# Patient Record
Sex: Female | Born: 1945 | Race: White | Hispanic: No | Marital: Married | Smoking: Current every day smoker
Health system: Southern US, Community
[De-identification: ages and names within clinical notes are randomized; demographics above are authoritative.]

## PROBLEM LIST (undated history)

## (undated) DIAGNOSIS — J449 Chronic obstructive pulmonary disease, unspecified: Secondary | ICD-10-CM

## (undated) DIAGNOSIS — I10 Essential (primary) hypertension: Secondary | ICD-10-CM

## (undated) DIAGNOSIS — M5136 Other intervertebral disc degeneration, lumbar region: Secondary | ICD-10-CM

## (undated) DIAGNOSIS — I639 Cerebral infarction, unspecified: Secondary | ICD-10-CM

## (undated) DIAGNOSIS — D693 Immune thrombocytopenic purpura: Secondary | ICD-10-CM

## (undated) HISTORY — PX: PLEURAL SCARIFICATION: SHX748

## (undated) HISTORY — PX: ABDOMINAL HYSTERECTOMY: SHX81

---

## 2005-12-23 ENCOUNTER — Emergency Department: Payer: Self-pay | Admitting: Emergency Medicine

## 2005-12-23 ENCOUNTER — Other Ambulatory Visit: Payer: Self-pay

## 2008-04-08 ENCOUNTER — Emergency Department (HOSPITAL_COMMUNITY): Admission: EM | Admit: 2008-04-08 | Discharge: 2008-04-08 | Payer: Self-pay | Admitting: Emergency Medicine

## 2008-11-02 ENCOUNTER — Emergency Department (HOSPITAL_COMMUNITY): Admission: EM | Admit: 2008-11-02 | Discharge: 2008-11-02 | Payer: Self-pay | Admitting: Emergency Medicine

## 2009-02-21 ENCOUNTER — Emergency Department (HOSPITAL_COMMUNITY): Admission: EM | Admit: 2009-02-21 | Discharge: 2009-02-21 | Payer: Self-pay | Admitting: Emergency Medicine

## 2009-05-20 ENCOUNTER — Emergency Department (HOSPITAL_COMMUNITY): Admission: EM | Admit: 2009-05-20 | Discharge: 2009-05-20 | Payer: Self-pay | Admitting: Emergency Medicine

## 2009-10-27 ENCOUNTER — Emergency Department (HOSPITAL_COMMUNITY): Admission: EM | Admit: 2009-10-27 | Discharge: 2009-10-27 | Payer: Self-pay | Admitting: Emergency Medicine

## 2009-12-26 ENCOUNTER — Emergency Department (HOSPITAL_COMMUNITY): Admission: EM | Admit: 2009-12-26 | Discharge: 2009-12-26 | Payer: Self-pay | Admitting: Emergency Medicine

## 2010-02-09 ENCOUNTER — Inpatient Hospital Stay (HOSPITAL_COMMUNITY): Admission: EM | Admit: 2010-02-09 | Discharge: 2010-02-11 | Payer: Self-pay | Admitting: Emergency Medicine

## 2010-02-10 ENCOUNTER — Ambulatory Visit: Payer: Self-pay | Admitting: Gastroenterology

## 2010-02-12 ENCOUNTER — Emergency Department (HOSPITAL_COMMUNITY): Admission: EM | Admit: 2010-02-12 | Discharge: 2010-02-12 | Payer: Self-pay | Admitting: Emergency Medicine

## 2010-02-26 ENCOUNTER — Ambulatory Visit: Payer: Self-pay | Admitting: Gastroenterology

## 2010-04-25 ENCOUNTER — Emergency Department (HOSPITAL_COMMUNITY)
Admission: EM | Admit: 2010-04-25 | Discharge: 2010-04-25 | Payer: Self-pay | Source: Home / Self Care | Admitting: Emergency Medicine

## 2010-04-29 ENCOUNTER — Inpatient Hospital Stay (HOSPITAL_COMMUNITY): Admission: EM | Admit: 2010-04-29 | Discharge: 2010-04-30 | Payer: Self-pay | Source: Home / Self Care

## 2010-08-05 LAB — COMPREHENSIVE METABOLIC PANEL
AST: 38 U/L — ABNORMAL HIGH (ref 0–37)
Alkaline Phosphatase: 108 U/L (ref 39–117)
BUN: 22 mg/dL (ref 6–23)
CO2: 22 mEq/L (ref 19–32)
Chloride: 109 mEq/L (ref 96–112)
Creatinine, Ser: 1.41 mg/dL — ABNORMAL HIGH (ref 0.4–1.2)
GFR calc non Af Amer: 38 mL/min — ABNORMAL LOW (ref >60)
Potassium: 4.8 mEq/L (ref 3.5–5.1)
Total Bilirubin: 0.5 mg/dL (ref 0.3–1.2)

## 2010-08-05 LAB — CBC
HCT: 35.8 % — ABNORMAL LOW (ref 36.0–46.0)
HCT: 38.3 % (ref 36.0–46.0)
Hemoglobin: 12.4 g/dL (ref 12.0–15.0)
Hemoglobin: 13.5 g/dL (ref 12.0–15.0)
MCH: 31.1 pg (ref 26.0–34.0)
MCHC: 35.2 g/dL (ref 30.0–36.0)
MCV: 89.7 fL (ref 78.0–100.0)
Platelets: 93 10*3/uL — ABNORMAL LOW (ref 150–400)
RBC: 3.99 MIL/uL (ref 3.87–5.11)
WBC: 14.3 10*3/uL — ABNORMAL HIGH (ref 4.0–10.5)
WBC: 15.6 10*3/uL — ABNORMAL HIGH (ref 4.0–10.5)

## 2010-08-05 LAB — DIFFERENTIAL
Basophils Absolute: 0 10*3/uL (ref 0.0–0.1)
Basophils Relative: 0 % (ref 0–1)
Eosinophils Absolute: 0 10*3/uL (ref 0.0–0.7)
Eosinophils Relative: 0 % (ref 0–5)
Eosinophils Relative: 0 % (ref 0–5)
Lymphocytes Relative: 5 % — ABNORMAL LOW (ref 12–46)
Lymphocytes Relative: 6 % — ABNORMAL LOW (ref 12–46)
Lymphs Abs: 0.8 10*3/uL (ref 0.7–4.0)
Monocytes Absolute: 0.2 10*3/uL (ref 0.1–1.0)
Monocytes Absolute: 0.5 10*3/uL (ref 0.1–1.0)
Monocytes Relative: 4 % (ref 3–12)
Neutro Abs: 14.5 10*3/uL — ABNORMAL HIGH (ref 1.7–7.7)

## 2010-08-05 LAB — BASIC METABOLIC PANEL
CO2: 22 mEq/L (ref 19–32)
Chloride: 103 mEq/L (ref 96–112)
Creatinine, Ser: 1.5 mg/dL — ABNORMAL HIGH (ref 0.4–1.2)
GFR calc Af Amer: 42 mL/min — ABNORMAL LOW (ref >60)
Potassium: 3.6 mEq/L (ref 3.5–5.1)

## 2010-08-05 LAB — BLOOD GAS, ARTERIAL
Acid-base deficit: 0.7 mmol/L (ref 0.0–2.0)
Bicarbonate: 22.3 mEq/L (ref 20.0–24.0)
O2 Saturation: 92.1 %
Patient temperature: 37
TCO2: 19.6 mmol/L (ref 0–100)
pO2, Arterial: 57.1 mmHg — ABNORMAL LOW (ref 80.0–100.0)

## 2010-08-05 LAB — GLUCOSE, CAPILLARY: Glucose-Capillary: 148 mg/dL — ABNORMAL HIGH (ref 70–99)

## 2010-08-07 LAB — CARDIAC PANEL(CRET KIN+CKTOT+MB+TROPI)
CK, MB: 3.6 ng/mL (ref 0.3–4.0)
CK, MB: 3.8 ng/mL (ref 0.3–4.0)
CK, MB: 4.1 ng/mL — ABNORMAL HIGH (ref 0.3–4.0)
CK, MB: 4.6 ng/mL — ABNORMAL HIGH (ref 0.3–4.0)
Relative Index: 0.6 (ref 0.0–2.5)
Relative Index: 0.9 (ref 0.0–2.5)
Total CK: 763 U/L — ABNORMAL HIGH (ref 7–177)
Troponin I: 0.02 ng/mL (ref 0.00–0.06)

## 2010-08-07 LAB — URINALYSIS, ROUTINE W REFLEX MICROSCOPIC
Glucose, UA: NEGATIVE mg/dL
Glucose, UA: NEGATIVE mg/dL
Nitrite: NEGATIVE
Protein, ur: NEGATIVE mg/dL
Protein, ur: NEGATIVE mg/dL
Urobilinogen, UA: 0.2 mg/dL (ref 0.0–1.0)
pH: 5.5 (ref 5.0–8.0)

## 2010-08-07 LAB — RAPID URINE DRUG SCREEN, HOSP PERFORMED
Amphetamines: NOT DETECTED
Barbiturates: NOT DETECTED
Tetrahydrocannabinol: NOT DETECTED

## 2010-08-07 LAB — MRSA PCR SCREENING: MRSA by PCR: NEGATIVE

## 2010-08-07 LAB — COMPREHENSIVE METABOLIC PANEL
ALT: 22 U/L (ref 0–35)
AST: 28 U/L (ref 0–37)
Albumin: 3.4 g/dL — ABNORMAL LOW (ref 3.5–5.2)
Albumin: 3.6 g/dL (ref 3.5–5.2)
Alkaline Phosphatase: 99 U/L (ref 39–117)
BUN: 15 mg/dL (ref 6–23)
Calcium: 8.9 mg/dL (ref 8.4–10.5)
Calcium: 8.9 mg/dL (ref 8.4–10.5)
Creatinine, Ser: 1.77 mg/dL — ABNORMAL HIGH (ref 0.4–1.2)
GFR calc Af Amer: 35 mL/min — ABNORMAL LOW (ref >60)
GFR calc Af Amer: 49 mL/min — ABNORMAL LOW (ref >60)
Glucose, Bld: 106 mg/dL — ABNORMAL HIGH (ref 70–99)
Potassium: 3 mEq/L — ABNORMAL LOW (ref 3.5–5.1)
Sodium: 139 mEq/L (ref 135–145)
Total Protein: 6.1 g/dL (ref 6.0–8.3)
Total Protein: 6.6 g/dL (ref 6.0–8.3)

## 2010-08-07 LAB — CBC
HCT: 35 % — ABNORMAL LOW (ref 36.0–46.0)
HCT: 37.2 % (ref 36.0–46.0)
HCT: 37.8 % (ref 36.0–46.0)
Hemoglobin: 12.1 g/dL (ref 12.0–15.0)
Hemoglobin: 13 g/dL (ref 12.0–15.0)
MCH: 32 pg (ref 26.0–34.0)
MCHC: 34.4 g/dL (ref 30.0–36.0)
MCV: 92.5 fL (ref 78.0–100.0)
MCV: 93 fL (ref 78.0–100.0)
Platelets: 56 10*3/uL — ABNORMAL LOW (ref 150–400)
RBC: 3.8 MIL/uL — ABNORMAL LOW (ref 3.87–5.11)
RBC: 4.06 MIL/uL (ref 3.87–5.11)
RDW: 13 % (ref 11.5–15.5)
RDW: 13.1 % (ref 11.5–15.5)
RDW: 13.3 % (ref 11.5–15.5)
WBC: 13 10*3/uL — ABNORMAL HIGH (ref 4.0–10.5)
WBC: 5.9 10*3/uL (ref 4.0–10.5)
WBC: 6 10*3/uL (ref 4.0–10.5)

## 2010-08-07 LAB — BASIC METABOLIC PANEL
BUN: 11 mg/dL (ref 6–23)
CO2: 18 mEq/L — ABNORMAL LOW (ref 19–32)
Chloride: 116 mEq/L — ABNORMAL HIGH (ref 96–112)
Glucose, Bld: 97 mg/dL (ref 70–99)
Potassium: 3.6 mEq/L (ref 3.5–5.1)

## 2010-08-07 LAB — CLOSTRIDIUM DIFFICILE EIA

## 2010-08-07 LAB — DIFFERENTIAL
Basophils Absolute: 0 10*3/uL (ref 0.0–0.1)
Basophils Absolute: 0 10*3/uL (ref 0.0–0.1)
Basophils Absolute: 0.1 10*3/uL (ref 0.0–0.1)
Basophils Relative: 1 % (ref 0–1)
Eosinophils Absolute: 0.3 10*3/uL (ref 0.0–0.7)
Eosinophils Relative: 0 % (ref 0–5)
Eosinophils Relative: 4 % (ref 0–5)
Lymphocytes Relative: 10 % — ABNORMAL LOW (ref 12–46)
Lymphocytes Relative: 21 % (ref 12–46)
Lymphocytes Relative: 7 % — ABNORMAL LOW (ref 12–46)
Lymphs Abs: 0.9 10*3/uL (ref 0.7–4.0)
Lymphs Abs: 1 10*3/uL (ref 0.7–4.0)
Lymphs Abs: 1.7 10*3/uL (ref 0.7–4.0)
Monocytes Absolute: 0.5 10*3/uL (ref 0.1–1.0)
Monocytes Absolute: 0.6 10*3/uL (ref 0.1–1.0)
Monocytes Absolute: 0.7 10*3/uL (ref 0.1–1.0)
Monocytes Absolute: 0.8 10*3/uL (ref 0.1–1.0)
Monocytes Relative: 12 % (ref 3–12)
Monocytes Relative: 14 % — ABNORMAL HIGH (ref 3–12)
Monocytes Relative: 6 % (ref 3–12)
Neutro Abs: 11.5 10*3/uL — ABNORMAL HIGH (ref 1.7–7.7)
Neutro Abs: 3.8 10*3/uL (ref 1.7–7.7)
Neutro Abs: 8.5 10*3/uL — ABNORMAL HIGH (ref 1.7–7.7)
Neutrophils Relative %: 53 % (ref 43–77)
Neutrophils Relative %: 65 % (ref 43–77)

## 2010-08-07 LAB — TSH: TSH: 1.197 u[IU]/mL (ref 0.350–4.500)

## 2010-08-07 LAB — OVA AND PARASITE EXAMINATION

## 2010-08-07 LAB — PATHOLOGIST SMEAR REVIEW

## 2010-08-07 LAB — URINE MICROSCOPIC-ADD ON

## 2010-08-07 LAB — POCT I-STAT, CHEM 8
BUN: 15 mg/dL (ref 6–23)
Chloride: 107 mEq/L (ref 96–112)
Creatinine, Ser: 1.8 mg/dL — ABNORMAL HIGH (ref 0.4–1.2)
Potassium: 3.5 mEq/L (ref 3.5–5.1)
Sodium: 137 mEq/L (ref 135–145)
TCO2: 19 mmol/L (ref 0–100)

## 2010-08-07 LAB — STOOL CULTURE

## 2010-08-07 LAB — CLOSTRIDIUM DIFFICILE BY PCR: Toxigenic C. Difficile by PCR: NOT DETECTED

## 2010-08-07 LAB — MAGNESIUM: Magnesium: 1.9 mg/dL (ref 1.5–2.5)

## 2010-08-14 ENCOUNTER — Encounter (HOSPITAL_COMMUNITY): Payer: Self-pay | Admitting: Radiology

## 2010-08-14 ENCOUNTER — Emergency Department (HOSPITAL_COMMUNITY)
Admission: EM | Admit: 2010-08-14 | Discharge: 2010-08-14 | Discharge status: Against Medical Advice | Payer: Medicare PPO | Attending: Emergency Medicine | Admitting: Emergency Medicine

## 2010-08-14 ENCOUNTER — Emergency Department (HOSPITAL_COMMUNITY): Payer: Medicare PPO

## 2010-08-14 DIAGNOSIS — Z8679 Personal history of other diseases of the circulatory system: Secondary | ICD-10-CM | POA: Insufficient documentation

## 2010-08-14 DIAGNOSIS — G459 Transient cerebral ischemic attack, unspecified: Secondary | ICD-10-CM | POA: Insufficient documentation

## 2010-08-14 DIAGNOSIS — J4489 Other specified chronic obstructive pulmonary disease: Secondary | ICD-10-CM | POA: Insufficient documentation

## 2010-08-14 DIAGNOSIS — I10 Essential (primary) hypertension: Secondary | ICD-10-CM | POA: Insufficient documentation

## 2010-08-14 DIAGNOSIS — F17200 Nicotine dependence, unspecified, uncomplicated: Secondary | ICD-10-CM | POA: Insufficient documentation

## 2010-08-14 DIAGNOSIS — E7800 Pure hypercholesterolemia, unspecified: Secondary | ICD-10-CM | POA: Insufficient documentation

## 2010-08-14 DIAGNOSIS — J449 Chronic obstructive pulmonary disease, unspecified: Secondary | ICD-10-CM | POA: Insufficient documentation

## 2010-08-14 DIAGNOSIS — R4182 Altered mental status, unspecified: Secondary | ICD-10-CM | POA: Insufficient documentation

## 2010-08-14 HISTORY — DX: Essential (primary) hypertension: I10

## 2010-08-16 ENCOUNTER — Emergency Department (HOSPITAL_COMMUNITY): Payer: Medicare PPO

## 2010-08-16 ENCOUNTER — Inpatient Hospital Stay (HOSPITAL_COMMUNITY)
Admission: EM | Admit: 2010-08-16 | Discharge: 2010-08-16 | DRG: 065 | Discharge status: Against Medical Advice | Payer: Medicare PPO | Attending: Internal Medicine | Admitting: Internal Medicine

## 2010-08-16 DIAGNOSIS — F17200 Nicotine dependence, unspecified, uncomplicated: Secondary | ICD-10-CM | POA: Diagnosis present

## 2010-08-16 DIAGNOSIS — I10 Essential (primary) hypertension: Secondary | ICD-10-CM | POA: Diagnosis present

## 2010-08-16 DIAGNOSIS — D693 Immune thrombocytopenic purpura: Secondary | ICD-10-CM | POA: Diagnosis present

## 2010-08-16 DIAGNOSIS — Z8673 Personal history of transient ischemic attack (TIA), and cerebral infarction without residual deficits: Secondary | ICD-10-CM

## 2010-08-16 DIAGNOSIS — I6350 Cerebral infarction due to unspecified occlusion or stenosis of unspecified cerebral artery: Principal | ICD-10-CM | POA: Diagnosis present

## 2010-08-16 LAB — BASIC METABOLIC PANEL
CO2: 21 mEq/L (ref 19–32)
Calcium: 9.6 mg/dL (ref 8.4–10.5)
Creatinine, Ser: 1.4 mg/dL — ABNORMAL HIGH (ref 0.4–1.2)
GFR calc Af Amer: 46 mL/min — ABNORMAL LOW (ref >60)
GFR calc non Af Amer: 38 mL/min — ABNORMAL LOW (ref >60)
Sodium: 138 mEq/L (ref 135–145)

## 2010-08-16 LAB — CBC
Hemoglobin: 13.6 g/dL (ref 12.0–15.0)
MCH: 31.2 pg (ref 26.0–34.0)
MCHC: 33.9 g/dL (ref 30.0–36.0)
RDW: 13.2 % (ref 11.5–15.5)

## 2010-08-16 LAB — DIFFERENTIAL
Basophils Absolute: 0.1 10*3/uL (ref 0.0–0.1)
Basophils Relative: 1 % (ref 0–1)
Eosinophils Absolute: 0.3 10*3/uL (ref 0.0–0.7)
Eosinophils Relative: 3 % (ref 0–5)
Monocytes Absolute: 0.6 10*3/uL (ref 0.1–1.0)
Monocytes Relative: 6 % (ref 3–12)
Neutro Abs: 6.5 10*3/uL (ref 1.7–7.7)

## 2010-08-16 LAB — URINALYSIS, ROUTINE W REFLEX MICROSCOPIC
Bilirubin Urine: NEGATIVE
Hgb urine dipstick: NEGATIVE
Nitrite: NEGATIVE
Protein, ur: NEGATIVE mg/dL
Urobilinogen, UA: 0.2 mg/dL (ref 0.0–1.0)

## 2010-08-25 LAB — BASIC METABOLIC PANEL
CO2: 27 mEq/L (ref 19–32)
Calcium: 9.7 mg/dL (ref 8.4–10.5)
Creatinine, Ser: 1.34 mg/dL — ABNORMAL HIGH (ref 0.4–1.2)
GFR calc non Af Amer: 40 mL/min — ABNORMAL LOW (ref >60)
Glucose, Bld: 82 mg/dL (ref 70–99)

## 2010-08-25 LAB — AFB CULTURE WITH SMEAR (NOT AT ARMC)

## 2010-08-25 LAB — CULTURE, RESPIRATORY W GRAM STAIN

## 2010-08-25 LAB — CBC
MCHC: 33.4 g/dL (ref 30.0–36.0)
Platelets: 124 10*3/uL — ABNORMAL LOW (ref 150–400)
RDW: 13.1 % (ref 11.5–15.5)

## 2010-08-25 LAB — DIFFERENTIAL
Basophils Absolute: 0.1 10*3/uL (ref 0.0–0.1)
Basophils Relative: 1 % (ref 0–1)
Neutro Abs: 5.7 10*3/uL (ref 1.7–7.7)
Neutrophils Relative %: 67 % (ref 43–77)

## 2010-08-29 LAB — CBC
HCT: 38.5 % (ref 36.0–46.0)
Hemoglobin: 13.5 g/dL (ref 12.0–15.0)
Platelets: 162 10*3/uL (ref 150–400)
RBC: 4.19 MIL/uL (ref 3.87–5.11)
WBC: 8.6 10*3/uL (ref 4.0–10.5)

## 2010-08-29 LAB — DIFFERENTIAL
Basophils Absolute: 0 10*3/uL (ref 0.0–0.1)
Basophils Relative: 0 % (ref 0–1)
Eosinophils Relative: 2 % (ref 0–5)
Monocytes Absolute: 0.4 10*3/uL (ref 0.1–1.0)
Neutro Abs: 6.6 10*3/uL (ref 1.7–7.7)

## 2010-08-29 LAB — COMPREHENSIVE METABOLIC PANEL
Albumin: 3.8 g/dL (ref 3.5–5.2)
Alkaline Phosphatase: 135 U/L — ABNORMAL HIGH (ref 39–117)
BUN: 16 mg/dL (ref 6–23)
Chloride: 107 mEq/L (ref 96–112)
Potassium: 3.4 mEq/L — ABNORMAL LOW (ref 3.5–5.1)
Total Bilirubin: 0.3 mg/dL (ref 0.3–1.2)

## 2010-08-29 LAB — APTT: aPTT: 26 seconds (ref 24–37)

## 2010-08-29 LAB — PROTIME-INR: INR: 1 (ref 0.00–1.49)

## 2010-09-01 LAB — URINALYSIS, ROUTINE W REFLEX MICROSCOPIC
Bilirubin Urine: NEGATIVE
Hgb urine dipstick: NEGATIVE
Ketones, ur: NEGATIVE mg/dL
Nitrite: NEGATIVE
Urobilinogen, UA: 0.2 mg/dL (ref 0.0–1.0)
pH: 5.5 (ref 5.0–8.0)

## 2010-09-01 LAB — BASIC METABOLIC PANEL
BUN: 13 mg/dL (ref 6–23)
CO2: 28 mEq/L (ref 19–32)
GFR calc non Af Amer: 43 mL/min — ABNORMAL LOW (ref >60)
Glucose, Bld: 95 mg/dL (ref 70–99)
Potassium: 3.7 mEq/L (ref 3.5–5.1)
Sodium: 140 mEq/L (ref 135–145)

## 2012-04-25 ENCOUNTER — Emergency Department (HOSPITAL_COMMUNITY): Payer: Medicare PPO

## 2012-04-25 ENCOUNTER — Encounter (HOSPITAL_COMMUNITY): Payer: Self-pay | Admitting: *Deleted

## 2012-04-25 ENCOUNTER — Emergency Department (HOSPITAL_COMMUNITY)
Admission: EM | Admit: 2012-04-25 | Discharge: 2012-04-25 | Disposition: A | Discharge status: Home / Self Care | Payer: Medicare PPO | Attending: Emergency Medicine | Admitting: Emergency Medicine

## 2012-04-25 DIAGNOSIS — M51379 Other intervertebral disc degeneration, lumbosacral region without mention of lumbar back pain or lower extremity pain: Secondary | ICD-10-CM | POA: Insufficient documentation

## 2012-04-25 DIAGNOSIS — M5137 Other intervertebral disc degeneration, lumbosacral region: Secondary | ICD-10-CM | POA: Insufficient documentation

## 2012-04-25 DIAGNOSIS — W19XXXA Unspecified fall, initial encounter: Secondary | ICD-10-CM

## 2012-04-25 DIAGNOSIS — I10 Essential (primary) hypertension: Secondary | ICD-10-CM | POA: Insufficient documentation

## 2012-04-25 DIAGNOSIS — F17200 Nicotine dependence, unspecified, uncomplicated: Secondary | ICD-10-CM | POA: Insufficient documentation

## 2012-04-25 DIAGNOSIS — Y939 Activity, unspecified: Secondary | ICD-10-CM | POA: Insufficient documentation

## 2012-04-25 DIAGNOSIS — S20229A Contusion of unspecified back wall of thorax, initial encounter: Secondary | ICD-10-CM | POA: Insufficient documentation

## 2012-04-25 DIAGNOSIS — W11XXXA Fall on and from ladder, initial encounter: Secondary | ICD-10-CM | POA: Insufficient documentation

## 2012-04-25 DIAGNOSIS — Z8673 Personal history of transient ischemic attack (TIA), and cerebral infarction without residual deficits: Secondary | ICD-10-CM | POA: Insufficient documentation

## 2012-04-25 DIAGNOSIS — D693 Immune thrombocytopenic purpura: Secondary | ICD-10-CM | POA: Insufficient documentation

## 2012-04-25 DIAGNOSIS — Y929 Unspecified place or not applicable: Secondary | ICD-10-CM | POA: Insufficient documentation

## 2012-04-25 HISTORY — DX: Other intervertebral disc degeneration, lumbar region: M51.36

## 2012-04-25 HISTORY — DX: Immune thrombocytopenic purpura: D69.3

## 2012-04-25 HISTORY — DX: Cerebral infarction, unspecified: I63.9

## 2012-04-25 MED ORDER — OXYCODONE-ACETAMINOPHEN 5-325 MG PO TABS: 1.0000 | ORAL_TABLET | Freq: Four times a day (QID) | ORAL | Status: AC | PRN

## 2012-04-25 MED ORDER — OXYCODONE-ACETAMINOPHEN 5-325 MG PO TABS
1.0000 | ORAL_TABLET | Freq: Once | ORAL | Status: AC
  Administered 2012-04-25: 1 via ORAL
  Filled 2012-04-25: qty 1

## 2012-04-25 NOTE — ED Notes (Signed)
Fall today, mid  And low back pain.No HI,  No LOC Went to [facility] ER.and was not seen by MD

## 2012-04-25 NOTE — ED Provider Notes (Signed)
History     CSN: [id number]  Arrival date & time 04/25/12  1709   First MD Initiated Contact with Patient 04/25/12 1856      Chief Complaint  Patient presents with  . Fall    (Consider location/radiation/quality/duration/timing/severity/associated sxs/prior treatment) Patient is a 66 y.o. female presenting with fall. The history is provided by the patient (pt fell and hurt her back). No language interpreter was used.  Fall The accident occurred 3 to 5 hours ago. The fall occurred from a ladder. She fell from a height of 3 to 5 ft. She landed on a hard floor. Point of impact: back. Pain location: back. The pain is at a severity of 5/10. The pain is moderate. She was not ambulatory at the scene. There was no entrapment after the fall. There was no drug use involved in the accident. There was no alcohol use involved in the accident. Pertinent negatives include no abdominal pain, no hematuria and no headaches. Exacerbated by: movement. She has tried nothing for the symptoms. The treatment provided moderate relief.    Past Medical History  Diagnosis Date  . Hypertension   . Stroke   . ITP (idiopathic thrombocytopenic purpura)   . DDD (degenerative disc disease), lumbar     Past Surgical History  Procedure Date  . Pleural scarification   . Abdominal hysterectomy     History reviewed. No pertinent family history.  History  Substance Use Topics  . Smoking status: Current Every Day Smoker  . Smokeless tobacco: Not on file  . Alcohol Use: No    OB History    Grav Para Term Preterm Abortions TAB SAB Ect Mult Living                  Review of Systems  Constitutional: Negative for fatigue.  HENT: Negative for congestion, sinus pressure and ear discharge.   Eyes: Negative for discharge.  Respiratory: Negative for cough.   Cardiovascular: Negative for chest pain.  Gastrointestinal: Negative for abdominal pain and diarrhea.  Genitourinary: Negative for frequency and hematuria.   Musculoskeletal: Positive for back pain.  Skin: Negative for rash.  Neurological: Negative for seizures and headaches.  Hematological: Negative.   Psychiatric/Behavioral: Negative for hallucinations.    Allergies  Erythromycin  Home Medications   Current Outpatient Rx  Name  Route  Sig  Dispense  Refill  . OXYCODONE-ACETAMINOPHEN 5-325 MG PO TABS   Oral   Take 1 tablet by mouth every 6 (six) hours as needed for pain.   30 tablet   0     BP 169/107  Pulse 107  Temp 97.6 F (36.4 C) (Oral)  Resp 20  Ht 5\' 2"  (1.575 m)  Wt 130 lb (58.968 kg)  BMI 23.78 kg/m2  SpO2 100%  Physical Exam  Constitutional: She is oriented to person, place, and time. She appears well-developed.  HENT:  Head: Normocephalic and atraumatic.  Eyes: Conjunctivae normal and EOM are normal. No scleral icterus.  Neck: Neck supple. No thyromegaly present.  Cardiovascular: Normal rate and regular rhythm.  Exam reveals no gallop and no friction rub.   No murmur heard. Pulmonary/Chest: No stridor. She has no wheezes. She has no rales. She exhibits no tenderness.  Abdominal: She exhibits no distension. There is no tenderness. There is no rebound.  Musculoskeletal: Normal range of motion. She exhibits no edema.       Tender mid back with brusing  Lymphadenopathy:    She has no cervical adenopathy.  Neurological:  She is oriented to person, place, and time. Coordination normal.  Skin: No rash noted. No erythema.  Psychiatric: She has a normal mood and affect. Her behavior is normal.    ED Course  Procedures (including critical care time)  Labs Reviewed - No data to display Dg Ribs Unilateral W/chest Left  04/25/2012  *RADIOLOGY REPORT*  Clinical Data: Fall.  Mid to lower posterior rib pain.  LEFT RIBS AND CHEST - 3+ VIEW  Comparison: 04/30/2010  Findings: Normal heart size.  No pleural effusion or edema.  Postop change within the medial aspect of the left lung apex is noted. Similar to previous exam.   Dedicated views of the left ribs shows no displaced rib fractures.  IMPRESSION:  1.  No acute findings.  No displaced rib fractures identified.   Original Report Authenticated By: Signa Kell, M.D.      1. Back contusion   2. Fall       MDM          Benny Lennert, MD 04/25/12 2107

## 2012-08-24 IMAGING — CT CT HEAD W/O CM
1 of 2 series · 15 of 30 positions shown, 19 images · non-contrast
Comparison: 02/21/2009.

CLINICAL DATA: 64-year-old female altered mental status.  No known
injury.  Difficulties in the right upper extremity for 2 days.

CT HEAD WITHOUT CONTRAST
TECHNIQUE: Contiguous axial images were obtained from the base of
the skull through the vertex without contrast.

[Series 2: headseq 4.8 h37s · axial · 0.43mm/px · z∈[+73,+225]mm · 15 of 36 slices shown, 19 images]
[im 2/36  brain]
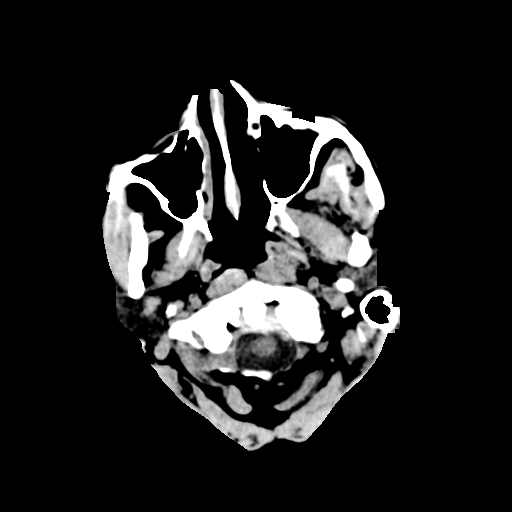
[im 2/36  bone]
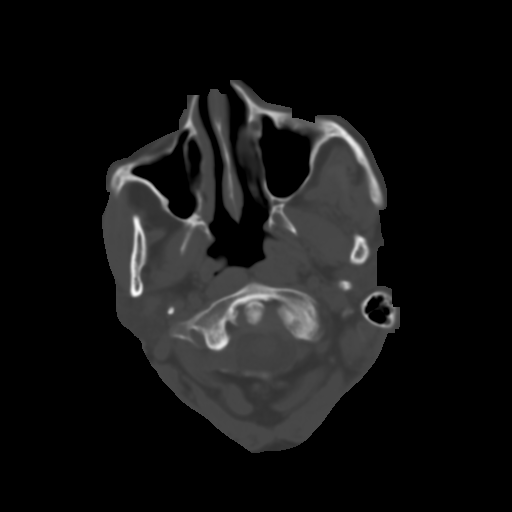
[im 6/36  brain]
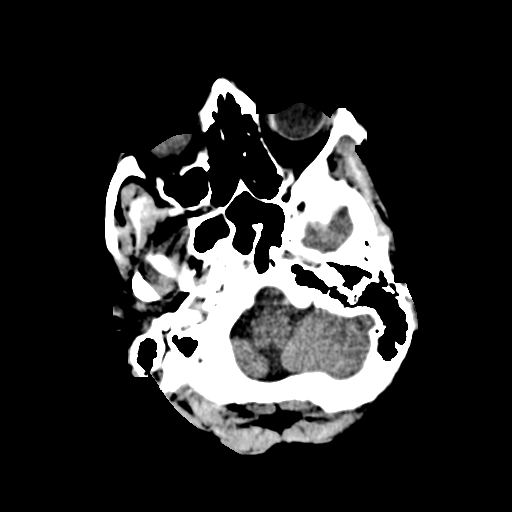
[im 7/36  brain]
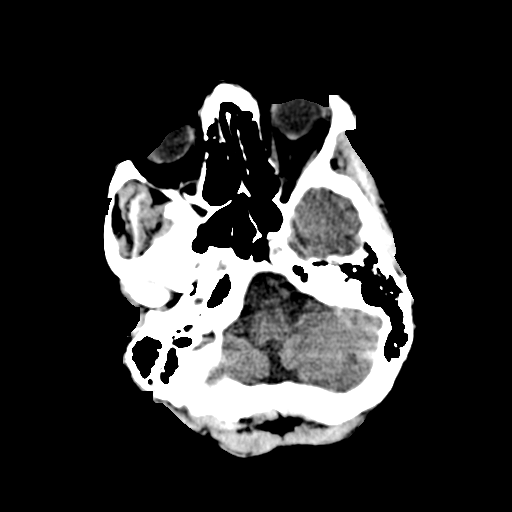
[im 9/36  brain]
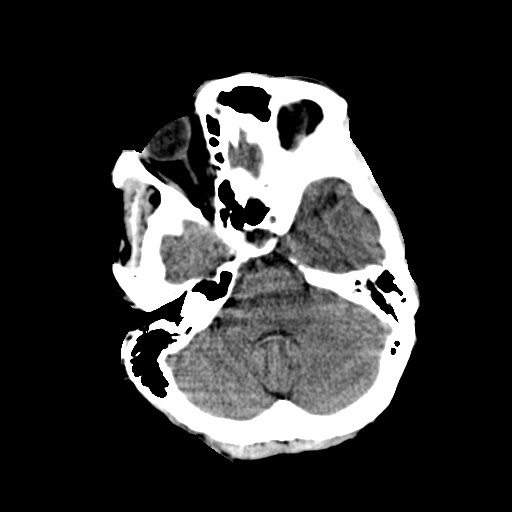
[im 12/36  brain]
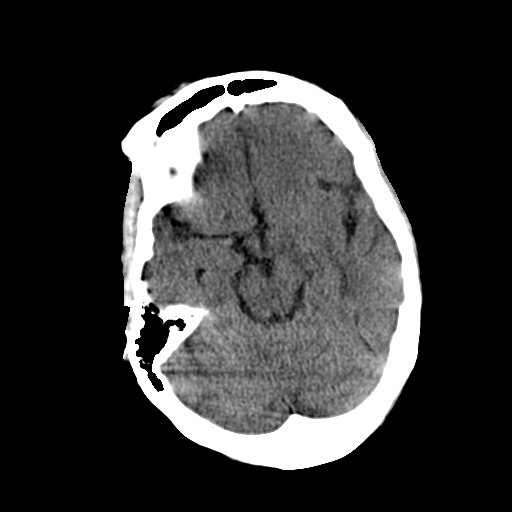
[im 12/36  bone]
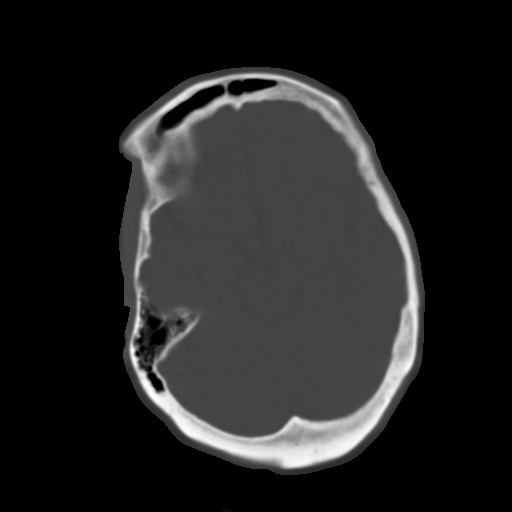
[im 14/36  brain]
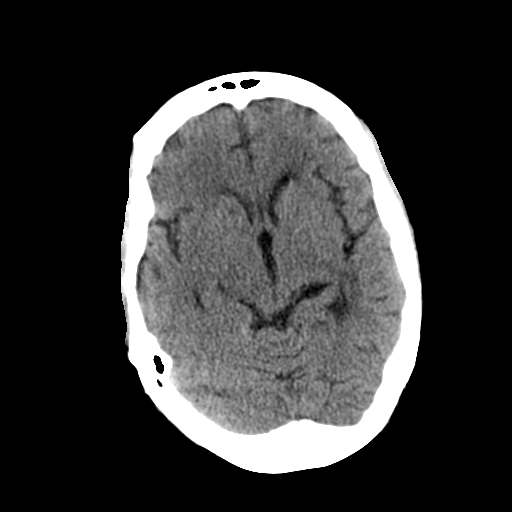
[im 16/36  brain]
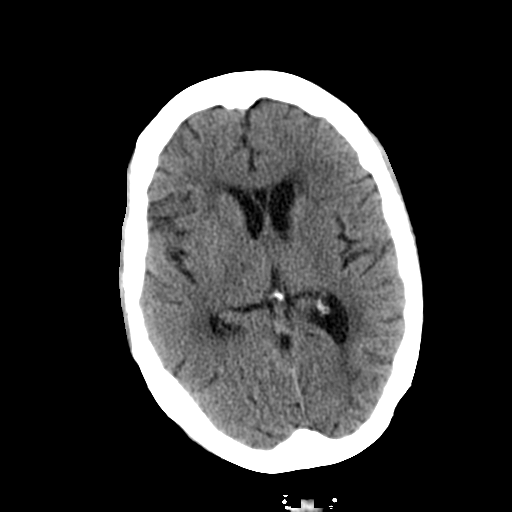
[im 19/36  brain]
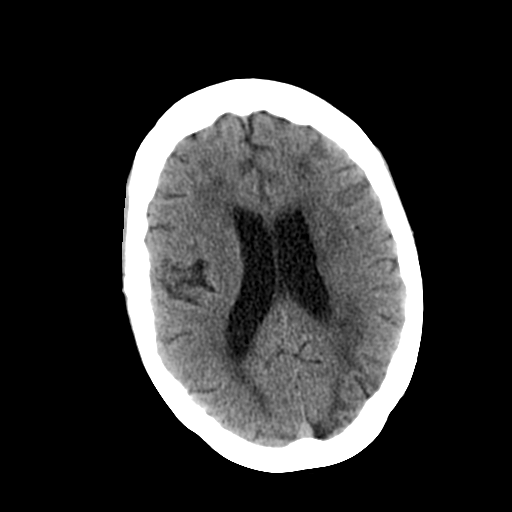
[im 21/36  brain]
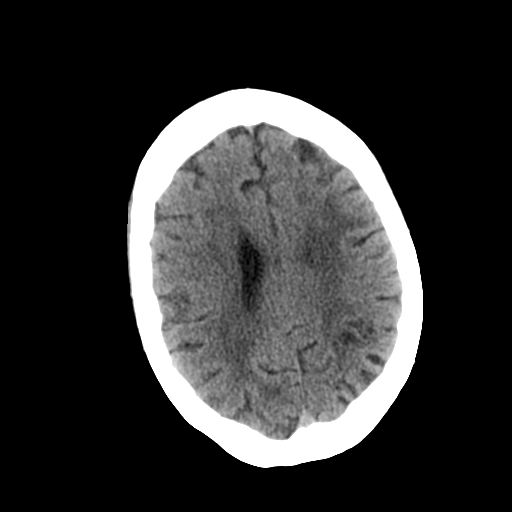
[im 21/36  bone]
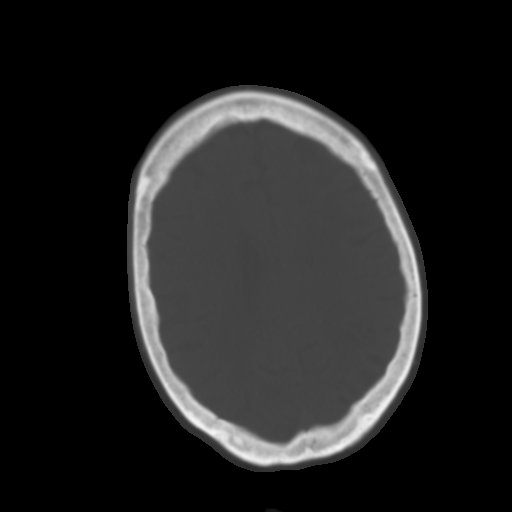
[im 22/36  brain]
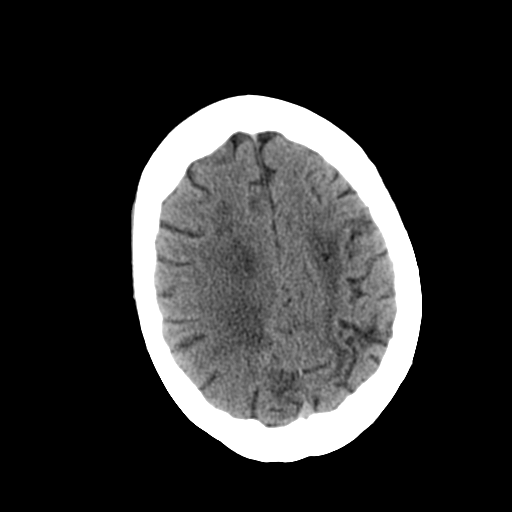
[im 26/36  brain]
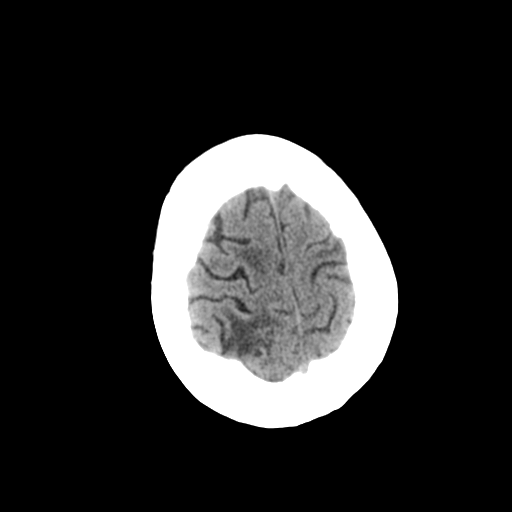
[im 27/36  brain]
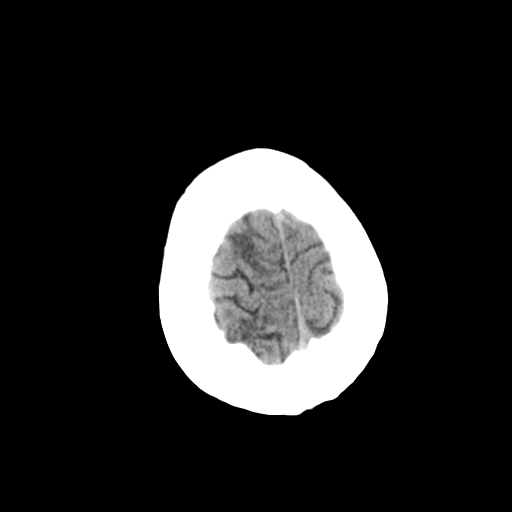
[im 29/36  brain]
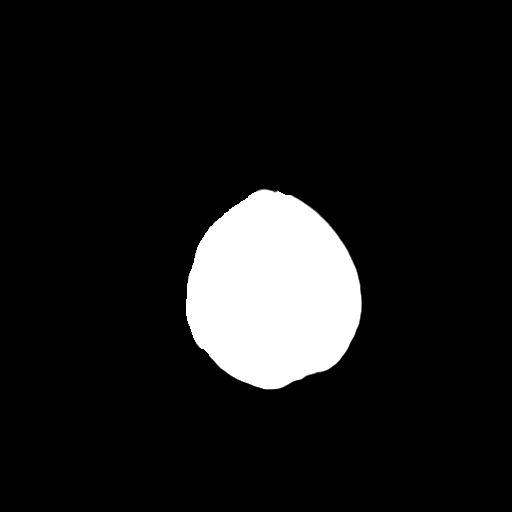
[im 29/36  bone]
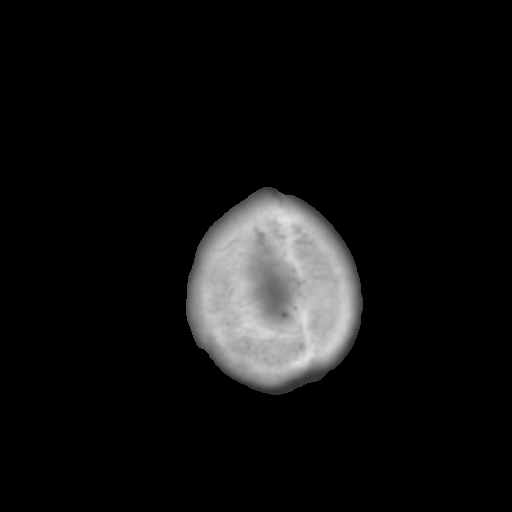
[im 32/36  brain]
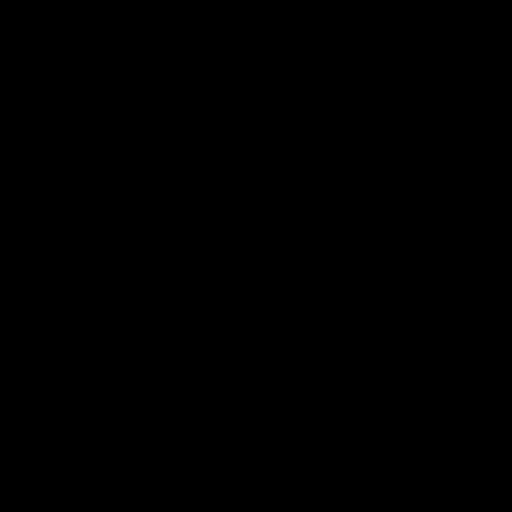
[im 34/36  brain]
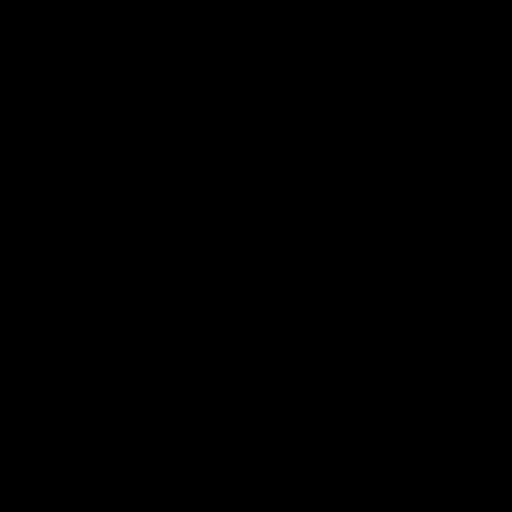

[15 of 30 positions shown; findings below may reference images not displayed]

FINDINGS: Visualized orbits and scalp soft tissues are within
normal limits.  No acute osseous abnormality identified.
Visualized paranasal sinuses and mastoids are clear.  Calcified
atherosclerosis at the skull base.

Chronic bilateral parietal lobe cortical and subcortical white
matter hypodensity/encephalomalacia.  There is confluent left
occipital lobe involvement.  There is patchy bilateral superior
frontal gyrus involvement.  Overall, Stable gray-white matter
differentiation throughout the brain.  No evidence of cortically
based acute infarction identified.  No acute intracranial
hemorrhage identified.  No midline shift, mass effect, or evidence
of mass lesion.  No ventriculomegaly. No suspicious intracranial
vascular hyperdensity.
IMPRESSION: Bilateral posterior MCA and PCA chronic ischemia appears stable
since 4212. No acute intracranial abnormality.

## 2013-05-07 ENCOUNTER — Encounter (HOSPITAL_COMMUNITY): Payer: Self-pay | Admitting: Emergency Medicine

## 2013-05-07 ENCOUNTER — Emergency Department (HOSPITAL_COMMUNITY): Payer: Medicare Other

## 2013-05-07 ENCOUNTER — Emergency Department (HOSPITAL_COMMUNITY)
Admission: EM | Admit: 2013-05-07 | Discharge: 2013-05-07 | Disposition: A | Discharge status: Home / Self Care | Payer: Medicare Other | Attending: Emergency Medicine | Admitting: Emergency Medicine

## 2013-05-07 DIAGNOSIS — M51379 Other intervertebral disc degeneration, lumbosacral region without mention of lumbar back pain or lower extremity pain: Secondary | ICD-10-CM | POA: Insufficient documentation

## 2013-05-07 DIAGNOSIS — I10 Essential (primary) hypertension: Secondary | ICD-10-CM | POA: Insufficient documentation

## 2013-05-07 DIAGNOSIS — S42002A Fracture of unspecified part of left clavicle, initial encounter for closed fracture: Secondary | ICD-10-CM

## 2013-05-07 DIAGNOSIS — S42023A Displaced fracture of shaft of unspecified clavicle, initial encounter for closed fracture: Secondary | ICD-10-CM | POA: Insufficient documentation

## 2013-05-07 DIAGNOSIS — Y9389 Activity, other specified: Secondary | ICD-10-CM | POA: Insufficient documentation

## 2013-05-07 DIAGNOSIS — W19XXXA Unspecified fall, initial encounter: Secondary | ICD-10-CM

## 2013-05-07 DIAGNOSIS — Z862 Personal history of diseases of the blood and blood-forming organs and certain disorders involving the immune mechanism: Secondary | ICD-10-CM | POA: Insufficient documentation

## 2013-05-07 DIAGNOSIS — W07XXXA Fall from chair, initial encounter: Secondary | ICD-10-CM | POA: Insufficient documentation

## 2013-05-07 DIAGNOSIS — F17200 Nicotine dependence, unspecified, uncomplicated: Secondary | ICD-10-CM | POA: Insufficient documentation

## 2013-05-07 DIAGNOSIS — M5137 Other intervertebral disc degeneration, lumbosacral region: Secondary | ICD-10-CM | POA: Insufficient documentation

## 2013-05-07 DIAGNOSIS — Z7982 Long term (current) use of aspirin: Secondary | ICD-10-CM | POA: Insufficient documentation

## 2013-05-07 DIAGNOSIS — Z8673 Personal history of transient ischemic attack (TIA), and cerebral infarction without residual deficits: Secondary | ICD-10-CM | POA: Insufficient documentation

## 2013-05-07 DIAGNOSIS — Z79899 Other long term (current) drug therapy: Secondary | ICD-10-CM | POA: Insufficient documentation

## 2013-05-07 DIAGNOSIS — Y92009 Unspecified place in unspecified non-institutional (private) residence as the place of occurrence of the external cause: Secondary | ICD-10-CM | POA: Insufficient documentation

## 2013-05-07 MED ORDER — LORAZEPAM 2 MG/ML IJ SOLN
0.5000 mg | Freq: Once | INTRAMUSCULAR | Status: AC
  Administered 2013-05-07: 0.5 mg via INTRAVENOUS
  Filled 2013-05-07: qty 1

## 2013-05-07 MED ORDER — HYDROCODONE-ACETAMINOPHEN 5-325 MG PO TABS: 1.0000 | ORAL_TABLET | Freq: Four times a day (QID) | ORAL | Status: DC | PRN

## 2013-05-07 MED ORDER — FENTANYL CITRATE 0.05 MG/ML IJ SOLN
50.0000 ug | INTRAMUSCULAR | Status: DC | PRN
  Administered 2013-05-07: 50 ug via INTRAVENOUS
  Filled 2013-05-07: qty 2

## 2013-05-07 MED ORDER — ONDANSETRON HCL 4 MG/2ML IJ SOLN
4.0000 mg | Freq: Once | INTRAMUSCULAR | Status: AC
  Administered 2013-05-07: 4 mg via INTRAVENOUS
  Filled 2013-05-07: qty 2

## 2013-05-07 NOTE — ED Provider Notes (Signed)
CSN: [id number]     Arrival date & time 05/07/13  0224 History   First MD Initiated Contact with Patient 05/07/13 [phone]     Chief Complaint  Patient presents with  . Fall  . Shoulder Pain   (Consider location/radiation/quality/duration/timing/severity/associated sxs/prior Treatment) HPI History provided by EMS and patient. States she was sitting in a chair at home, sneeze and fell forward with outstretched left upper extremity. Called EMS for severe left shoulder pain. She is able to move her left upper extremity but it does hurt. Just complains of pain to the left lateral neck. She denies striking her head. No LOC. No bleeding. No weakness or numbness. She denies any other pain or injury. Pain is sharp and severe, worse with movement of her left arm. She localizes pain to her left clavicle with obvious deformity.  Past Medical History  Diagnosis Date  . Hypertension   . Stroke   . ITP (idiopathic thrombocytopenic purpura)   . DDD (degenerative disc disease), lumbar    Past Surgical History  Procedure Laterality Date  . Pleural scarification    . Abdominal hysterectomy     No family history on file. History  Substance Use Topics  . Smoking status: Current Every Day Smoker  . Smokeless tobacco: Not on file  . Alcohol Use: No   OB History   Grav Para Term Preterm Abortions TAB SAB Ect Mult Living                 Review of Systems  Constitutional: Negative for fever and diaphoresis.  Eyes: Negative for visual disturbance.  Respiratory: Negative for shortness of breath.   Cardiovascular: Negative for chest pain.  Gastrointestinal: Negative for vomiting and abdominal pain.  Genitourinary: Negative for flank pain.  Musculoskeletal: Negative for back pain and neck stiffness.  Skin: Negative for wound.  Neurological: Negative for weakness and numbness.  All other systems reviewed and are negative.    Allergies  Erythromycin  Home Medications   Current Outpatient Rx   Name  Route  Sig  Dispense  Refill  . amLODipine-benazepril (LOTREL) 10-20 MG per capsule   Oral   Take 1 capsule by mouth daily.         Marland Kitchen aspirin 325 MG tablet   Oral   Take 325 mg by mouth every morning.         Marland Kitchen ipratropium-albuterol (DUONEB) 0.5-2.5 (3) MG/3ML SOLN   Nebulization   Take 3 mLs by nebulization every 6 (six) hours as needed. For shortness of breath         . HYDROcodone-acetaminophen (NORCO/VICODIN) 5-325 MG per tablet   Oral   Take 1 tablet by mouth every 6 (six) hours as needed for moderate pain or severe pain.   6 tablet   0   . metoprolol tartrate (LOPRESSOR) 25 MG tablet   Oral   Take 25 mg by mouth 2 (two) times daily.          BP 143/83  Pulse 120  Temp(Src) 98.2 F (36.8 C) (Oral)  Resp 14  SpO2 95% Physical Exam  Constitutional: She is oriented to person, place, and time. She appears well-developed and well-nourished.  HENT:  Head: Normocephalic and atraumatic.  Mouth/Throat: Oropharynx is clear and moist.  Eyes: EOM are normal. Pupils are equal, round, and reactive to light.  Neck: No tracheal deviation present.  Cervical collar in place  Cardiovascular: Normal rate, regular rhythm and intact distal pulses.   Pulmonary/Chest: Effort normal and  breath sounds normal. No respiratory distress.  Abdominal: Soft. Bowel sounds are normal. She exhibits no distension. There is no tenderness.  Musculoskeletal: She exhibits no edema.  Tenderness with deformity over the clavicle with skin intact. No significant tenting. There is associated tenderness over the left trapezius area. No significant tenderness over the shoulder with some decreased range of motion/ no deformity.  No tenderness over the elbow or wrist with full range of motion. No extremity tenderness or deformity otherwise.  Neurological: She is alert and oriented to person, place, and time. No cranial nerve deficit.  Skin: Skin is warm and dry.    ED Course  Procedures (including  critical care time) Labs Review Labs Reviewed - No data to display Imaging Review Dg Clavicle Left  05/07/2013   CLINICAL DATA:  Status post fall; left clavicular pain.  EXAM: LEFT CLAVICLE - 2+ VIEWS  COMPARISON:  Chest radiograph performed 04/25/2012  FINDINGS: There is a displaced fracture involving the middle third of the left clavicle, with shortening at the fracture site and mild superior angulation. The left acromioclavicular joint is unremarkable in appearance. Postoperative change is seen overlying the left lung apex. The visualized portions of the lungs are grossly clear.  IMPRESSION: Displaced fracture involving the middle third of the left clavicle, with shortening at the fracture site and mild superior angulation.   Electronically Signed   By: Roanna Raider M.D.   On: 05/07/2013 05:54   Ct Head Wo Contrast  05/07/2013   CLINICAL DATA:  Status post fall; altered mental status. Concern for cervical spine injury.  EXAM: CT HEAD WITHOUT CONTRAST  CT CERVICAL SPINE WITHOUT CONTRAST  TECHNIQUE: Multidetector CT imaging of the head and cervical spine was performed following the standard protocol without intravenous contrast. Multiplanar CT image reconstructions of the cervical spine were also generated.  COMPARISON:  CT of the head performed 08/16/2010, and CT of the head and cervical spine performed 11/02/2008  FINDINGS: CT HEAD FINDINGS  There is no evidence of acute infarction, mass lesion, or intra- or extra-axial hemorrhage on CT.  Chronic encephalomalacia is noted at the left parietal and occipital lobes, and also at the high right parietal lobe, reflecting remote infarcts. Scattered periventricular and subcortical white matter change likely reflects small vessel ischemic microangiopathy. A small chronic lacunar infarct is noted within the left cerebellar hemisphere.  The brainstem and fourth ventricle are within normal limits. The basal ganglia are unremarkable in appearance. No mass effect  or midline shift is seen.  There is no evidence of fracture; visualized osseous structures are unremarkable in appearance. The orbits are within normal limits. The paranasal sinuses and mastoid air cells are well-aerated. No significant soft tissue abnormalities are seen.  CT CERVICAL SPINE FINDINGS  There is no evidence of fracture or subluxation along the cervical spine. Vertebral bodies demonstrate normal height and alignment. There is mild disc space narrowing at C6-C7, with small anterior and posterior disc osteophyte complexes. Mild facet disease is noted along the cervical spine. Prevertebral soft tissues are within normal limits.  There is a mildly displaced fracture through the middle third of the left clavicle, with slight comminution.  The thyroid gland is unremarkable in appearance. Mild scarring is noted at the lung apices, with postoperative change noted at the left lung apex. Mild calcification is noted at the left carotid bifurcation.  IMPRESSION: 1. No evidence of traumatic intracranial injury. 2. No evidence of fracture or subluxation along the cervical spine. 3. Mildly displaced fracture through the  middle third of the left clavicle, with slight comminution. 4. Chronic encephalomalacia at the left parietal and occipital lobes, and also at the high right parietal lobe, reflecting remote infarcts. Diffuse small vessel ischemic microangiopathy. 5. Small chronic lacunar infarct within the left cerebellar hemisphere. 6. Mild scarring of the lung apices, with postoperative change at the left lung apex. 7. Mild calcification at the left carotid bifurcation.   Electronically Signed   By: Roanna Raider M.D.   On: 05/07/2013 07:05   Ct Cervical Spine Wo Contrast  05/07/2013   CLINICAL DATA:  Status post fall; altered mental status. Concern for cervical spine injury.  EXAM: CT HEAD WITHOUT CONTRAST  CT CERVICAL SPINE WITHOUT CONTRAST  TECHNIQUE: Multidetector CT imaging of the head and cervical spine  was performed following the standard protocol without intravenous contrast. Multiplanar CT image reconstructions of the cervical spine were also generated.  COMPARISON:  CT of the head performed 08/16/2010, and CT of the head and cervical spine performed 11/02/2008  FINDINGS: CT HEAD FINDINGS  There is no evidence of acute infarction, mass lesion, or intra- or extra-axial hemorrhage on CT.  Chronic encephalomalacia is noted at the left parietal and occipital lobes, and also at the high right parietal lobe, reflecting remote infarcts. Scattered periventricular and subcortical white matter change likely reflects small vessel ischemic microangiopathy. A small chronic lacunar infarct is noted within the left cerebellar hemisphere.  The brainstem and fourth ventricle are within normal limits. The basal ganglia are unremarkable in appearance. No mass effect or midline shift is seen.  There is no evidence of fracture; visualized osseous structures are unremarkable in appearance. The orbits are within normal limits. The paranasal sinuses and mastoid air cells are well-aerated. No significant soft tissue abnormalities are seen.  CT CERVICAL SPINE FINDINGS  There is no evidence of fracture or subluxation along the cervical spine. Vertebral bodies demonstrate normal height and alignment. There is mild disc space narrowing at C6-C7, with small anterior and posterior disc osteophyte complexes. Mild facet disease is noted along the cervical spine. Prevertebral soft tissues are within normal limits.  There is a mildly displaced fracture through the middle third of the left clavicle, with slight comminution.  The thyroid gland is unremarkable in appearance. Mild scarring is noted at the lung apices, with postoperative change noted at the left lung apex. Mild calcification is noted at the left carotid bifurcation.  IMPRESSION: 1. No evidence of traumatic intracranial injury. 2. No evidence of fracture or subluxation along the  cervical spine. 3. Mildly displaced fracture through the middle third of the left clavicle, with slight comminution. 4. Chronic encephalomalacia at the left parietal and occipital lobes, and also at the high right parietal lobe, reflecting remote infarcts. Diffuse small vessel ischemic microangiopathy. 5. Small chronic lacunar infarct within the left cerebellar hemisphere. 6. Mild scarring of the lung apices, with postoperative change at the left lung apex. 7. Mild calcification at the left carotid bifurcation.   Electronically Signed   By: Roanna Raider M.D.   On: 05/07/2013 07:05    7:11 AM pain improved, sling provided, patient lives at home with her son - is stable for discharge at this time.   Plan outpatient followup with orthopedics. Referral provided. Prescription for narcotic pain medications provided. Patient states understanding all discharge and followup instructions. She feels comfortable with plan for discharge home with family.  MDM   1. Fracture of left clavicle, closed, initial encounter   2. Fall, initial encounter  Imaging reviewed as above, no acute cervical spine injury. her cervical collar was removed and C-spine cleared. Sling provided for left clavicle fracture IV narcotics pain control Vital Signs and nursing notes reviewed and considered   Sunnie Nielsen, MD 05/07/13 2318

## 2013-05-07 NOTE — ED Notes (Signed)
Pt states she was sitting in a chair and she sneezed hard and fell out of the chair.  Pt has pain to left clavicle area with possible fracture.  Pt denies hitting her head

## 2013-05-07 NOTE — ED Notes (Signed)
Detailed discharge instructions given to the pt. Verbalized understanding of all. 1 script given.  To lobby in wheelchair to await her son or daughter to pick her up. rn spoke to her son Rayna Sexton on the phone in the exam room and he will arrange for transport home. Pt had attends changed of soiled urine prior to discharge.

## 2015-05-21 ENCOUNTER — Emergency Department (HOSPITAL_COMMUNITY)
Admission: EM | Admit: 2015-05-21 | Discharge: 2015-05-21 | Disposition: A | Discharge status: Home / Self Care | Payer: Medicare HMO | Attending: Emergency Medicine | Admitting: Emergency Medicine

## 2015-05-21 ENCOUNTER — Emergency Department (HOSPITAL_COMMUNITY): Payer: Medicare HMO

## 2015-05-21 ENCOUNTER — Encounter (HOSPITAL_COMMUNITY): Payer: Self-pay | Admitting: Emergency Medicine

## 2015-05-21 DIAGNOSIS — Z8673 Personal history of transient ischemic attack (TIA), and cerebral infarction without residual deficits: Secondary | ICD-10-CM | POA: Diagnosis not present

## 2015-05-21 DIAGNOSIS — S79912A Unspecified injury of left hip, initial encounter: Secondary | ICD-10-CM | POA: Insufficient documentation

## 2015-05-21 DIAGNOSIS — Z7982 Long term (current) use of aspirin: Secondary | ICD-10-CM | POA: Diagnosis not present

## 2015-05-21 DIAGNOSIS — Y9289 Other specified places as the place of occurrence of the external cause: Secondary | ICD-10-CM | POA: Insufficient documentation

## 2015-05-21 DIAGNOSIS — S79922A Unspecified injury of left thigh, initial encounter: Secondary | ICD-10-CM | POA: Diagnosis present

## 2015-05-21 DIAGNOSIS — Y9389 Activity, other specified: Secondary | ICD-10-CM | POA: Diagnosis not present

## 2015-05-21 DIAGNOSIS — Z79899 Other long term (current) drug therapy: Secondary | ICD-10-CM | POA: Insufficient documentation

## 2015-05-21 DIAGNOSIS — W1809XA Striking against other object with subsequent fall, initial encounter: Secondary | ICD-10-CM

## 2015-05-21 DIAGNOSIS — I10 Essential (primary) hypertension: Secondary | ICD-10-CM | POA: Diagnosis not present

## 2015-05-21 DIAGNOSIS — Z8739 Personal history of other diseases of the musculoskeletal system and connective tissue: Secondary | ICD-10-CM | POA: Insufficient documentation

## 2015-05-21 DIAGNOSIS — R000 Tachycardia, unspecified: Secondary | ICD-10-CM | POA: Diagnosis not present

## 2015-05-21 DIAGNOSIS — Z862 Personal history of diseases of the blood and blood-forming organs and certain disorders involving the immune mechanism: Secondary | ICD-10-CM | POA: Diagnosis not present

## 2015-05-21 DIAGNOSIS — Y998 Other external cause status: Secondary | ICD-10-CM | POA: Insufficient documentation

## 2015-05-21 DIAGNOSIS — J449 Chronic obstructive pulmonary disease, unspecified: Secondary | ICD-10-CM | POA: Insufficient documentation

## 2015-05-21 DIAGNOSIS — Z880 Allergy status to penicillin: Secondary | ICD-10-CM | POA: Insufficient documentation

## 2015-05-21 DIAGNOSIS — M79652 Pain in left thigh: Secondary | ICD-10-CM

## 2015-05-21 DIAGNOSIS — S79911A Unspecified injury of right hip, initial encounter: Secondary | ICD-10-CM | POA: Insufficient documentation

## 2015-05-21 DIAGNOSIS — W1839XA Other fall on same level, initial encounter: Secondary | ICD-10-CM | POA: Diagnosis not present

## 2015-05-21 DIAGNOSIS — F17200 Nicotine dependence, unspecified, uncomplicated: Secondary | ICD-10-CM | POA: Insufficient documentation

## 2015-05-21 DIAGNOSIS — R451 Restlessness and agitation: Secondary | ICD-10-CM | POA: Insufficient documentation

## 2015-05-21 DIAGNOSIS — N390 Urinary tract infection, site not specified: Secondary | ICD-10-CM | POA: Insufficient documentation

## 2015-05-21 DIAGNOSIS — I69359 Hemiplegia and hemiparesis following cerebral infarction affecting unspecified side: Secondary | ICD-10-CM | POA: Diagnosis not present

## 2015-05-21 HISTORY — DX: Chronic obstructive pulmonary disease, unspecified: J44.9

## 2015-05-21 LAB — URINALYSIS, ROUTINE W REFLEX MICROSCOPIC
BILIRUBIN URINE: NEGATIVE
GLUCOSE, UA: NEGATIVE mg/dL
Ketones, ur: NEGATIVE mg/dL
Nitrite: POSITIVE — AB
Protein, ur: NEGATIVE mg/dL
SPECIFIC GRAVITY, URINE: 1.01 (ref 1.005–1.030)
pH: 6 (ref 5.0–8.0)

## 2015-05-21 LAB — URINE MICROSCOPIC-ADD ON

## 2015-05-21 LAB — BASIC METABOLIC PANEL
ANION GAP: 10 (ref 5–15)
BUN: 21 mg/dL — ABNORMAL HIGH (ref 6–20)
CHLORIDE: 109 mmol/L (ref 101–111)
CO2: 22 mmol/L (ref 22–32)
Calcium: 10 mg/dL (ref 8.9–10.3)
Creatinine, Ser: 1.25 mg/dL — ABNORMAL HIGH (ref 0.44–1.00)
GFR calc non Af Amer: 43 mL/min — ABNORMAL LOW (ref >60)
GFR, EST AFRICAN AMERICAN: 50 mL/min — AB (ref >60)
GLUCOSE: 100 mg/dL — AB (ref 65–99)
Potassium: 4 mmol/L (ref 3.5–5.1)
Sodium: 141 mmol/L (ref 135–145)

## 2015-05-21 LAB — CBC WITH DIFFERENTIAL/PLATELET
Basophils Absolute: 0.1 10*3/uL (ref 0.0–0.1)
Basophils Relative: 1 %
EOS ABS: 0.1 10*3/uL (ref 0.0–0.7)
EOS PCT: 1 %
HCT: 42 % (ref 36.0–46.0)
HEMOGLOBIN: 14.3 g/dL (ref 12.0–15.0)
LYMPHS ABS: 1.8 10*3/uL (ref 0.7–4.0)
LYMPHS PCT: 17 %
MCH: 31.4 pg (ref 26.0–34.0)
MCHC: 34 g/dL (ref 30.0–36.0)
MCV: 92.3 fL (ref 78.0–100.0)
MONOS PCT: 7 %
Monocytes Absolute: 0.8 10*3/uL (ref 0.1–1.0)
Neutro Abs: 7.6 10*3/uL (ref 1.7–7.7)
Neutrophils Relative %: 74 %
PLATELETS: 131 10*3/uL — AB (ref 150–400)
RBC: 4.55 MIL/uL (ref 3.87–5.11)
RDW: 13 % (ref 11.5–15.5)
WBC: 10.2 10*3/uL (ref 4.0–10.5)

## 2015-05-21 LAB — C-REACTIVE PROTEIN: CRP: <0.5 mg/dL (ref <1.0)

## 2015-05-21 LAB — SEDIMENTATION RATE: SED RATE: 12 mm/h (ref 0–22)

## 2015-05-21 MED ORDER — SODIUM CHLORIDE 0.9 % IV BOLUS (SEPSIS)
500.0000 mL | Freq: Once | INTRAVENOUS | Status: AC
  Administered 2015-05-21: 500 mL via INTRAVENOUS

## 2015-05-21 MED ORDER — SODIUM CHLORIDE 0.9 % IV BOLUS (SEPSIS): 1000.0000 mL | Freq: Once | INTRAVENOUS | Status: DC

## 2015-05-21 MED ORDER — DIAZEPAM 5 MG/ML IJ SOLN
2.5000 mg | Freq: Once | INTRAMUSCULAR | Status: AC
  Administered 2015-05-21: 2.5 mg via INTRAVENOUS
  Filled 2015-05-21: qty 2

## 2015-05-21 MED ORDER — DEXTROSE 5 % IV SOLN
1.0000 g | Freq: Once | INTRAVENOUS | Status: AC
  Administered 2015-05-21: 1 g via INTRAVENOUS
  Filled 2015-05-21: qty 10

## 2015-05-21 MED ORDER — NITROFURANTOIN MONOHYD MACRO 100 MG PO CAPS: 100.0000 mg | ORAL_CAPSULE | Freq: Two times a day (BID) | ORAL | Status: AC

## 2015-05-21 MED ORDER — ACETAMINOPHEN 325 MG PO TABS
650.0000 mg | ORAL_TABLET | Freq: Once | ORAL | Status: AC
  Administered 2015-05-21: 650 mg via ORAL
  Filled 2015-05-21: qty 2

## 2015-05-21 MED ORDER — IBUPROFEN 600 MG PO TABS: 600.0000 mg | ORAL_TABLET | Freq: Four times a day (QID) | ORAL | Status: AC | PRN

## 2015-05-21 NOTE — ED Notes (Signed)
Tammy, PA at bedside.  

## 2015-05-21 NOTE — Discharge Instructions (Signed)

## 2015-05-21 NOTE — ED Notes (Signed)
Daughter Cala Bradfordkimberly called and will be here in 45 min to pick up pt. [phone]

## 2015-05-21 NOTE — ED Notes (Signed)
Bed linens changed after pt incontinence.

## 2015-05-21 NOTE — ED Provider Notes (Signed)
CSN: [id number]     Arrival date & time 05/21/15  [id number] History   First MD Initiated Contact with Patient 05/21/15 [phone]     Chief Complaint  Patient presents with  . Fall     (Consider location/radiation/quality/duration/timing/severity/associated sxs/prior Treatment) Patient is a 69 y.o. female presenting with fall. The history is provided by the patient.  Fall Associated symptoms include arthralgias (bilateral hip, left leg pain). Pertinent negatives include no abdominal pain, chest pain, chills, coughing, fever, headaches, nausea, neck pain, numbness, rash, vomiting or weakness.  Rhetta MuraBrenda D Goldston is a 69 y.o. female with hx of stroke, COPD and hasn't ambulated since her stroke,  who presents to the Emergency Department by EMS.  Patient states that she fell approximately 3 weeks ago without known injury.  She reports left posterior leg pain and bilateral hip pain.  She states that she has sharp pain intermittently that causes her leg to contract and pain will radiate down her entire leg.  She also reports right knee pain.  She states that she was sitting in the floor when she fell back.  She denies fever, vomiting,  head injury, neck or back pain, LOC, abdominal pain, chest pain or shortness of breath.  Pt states she lives with her son and is able to take care of herself at home.    Past Medical History  Diagnosis Date  . Hypertension   . Stroke (HCC)   . ITP (idiopathic thrombocytopenic purpura)   . DDD (degenerative disc disease), lumbar   . COPD (chronic obstructive pulmonary disease) [facility](HCC)    Past Surgical History  Procedure Laterality Date  . Pleural scarification    . Abdominal hysterectomy     History reviewed. No pertinent family history. Social History  Substance Use Topics  . Smoking status: Current Every Day Smoker  . Smokeless tobacco: None  . Alcohol Use: No   OB History    No data available     Review of Systems  Constitutional: Negative for fever, chills,  activity change and appetite change.  Eyes: Negative for visual disturbance.  Respiratory: Negative for cough and shortness of breath.   Cardiovascular: Negative for chest pain and leg swelling.  Gastrointestinal: Negative for nausea, vomiting, abdominal pain and diarrhea.  Musculoskeletal: Positive for arthralgias (bilateral hip, left leg pain). Negative for neck pain.  Skin: Negative for rash and wound.  Neurological: Negative for dizziness, weakness, numbness and headaches.  Psychiatric/Behavioral: The patient is not nervous/anxious.       Allergies  Erythromycin and Penicillins  Home Medications   Prior to Admission medications   Medication Sig Start Date End Date Taking? Authorizing Provider  albuterol (PROVENTIL) (2.5 MG/3ML) 0.083% nebulizer solution Take 2.5 mg by nebulization every 6 (six) hours as needed for wheezing or shortness of breath.  03/06/15  Yes Historical Provider, MD  amLODipine (NORVASC) 5 MG tablet Take 5 mg by mouth daily.   Yes Historical Provider, MD  aspirin 325 MG tablet Take 325 mg by mouth every morning.   Yes Historical Provider, MD   BP 149/105 mmHg  Pulse 110  Temp(Src) 98.6 F (37 C) (Oral)  Resp 20  Ht 5\' 4"  (1.626 m)  Wt 58.968 kg  BMI 22.30 kg/m2  SpO2 96% Physical Exam  Constitutional: She is oriented to person, place, and time. She is uncooperative. No distress.  Pt appears disheveled, poor hygiene habits  HENT:  Head: Normocephalic and atraumatic.  Mouth/Throat: Uvula is midline and oropharynx is clear and moist.  Mucous membranes are dry.  Eyes: Conjunctivae and EOM are normal. Pupils are equal, round, and reactive to light.  Neck: Normal range of motion, full passive range of motion without pain and phonation normal. Neck supple.  Cardiovascular: Regular rhythm and intact distal pulses.  Tachycardia present.   No murmur heard. Pulmonary/Chest: Effort normal and breath sounds normal. No respiratory distress.  Abdominal: Soft. She  exhibits no distension. There is no tenderness. There is no rebound and no guarding.  Musculoskeletal: She exhibits no edema.  Pt points to posterior left upper leg as area of pain.  No obvious edema, erythema, bruising.  NO calf pain, edema or erythema.  Pelvis NT, no tenderness of the spine.  5/5 strength against resistance of bilateral upper and lower extremities.  DP pulses brisk and symmetrical, distal sensation intact  Neurological: She is alert and oriented to person, place, and time. She has normal strength. No sensory deficit.  Reflex Scores:      Patellar reflexes are 2+ on the right side and 2+ on the left side.      Achilles reflexes are 2+ on the right side and 2+ on the left side. Skin: Skin is warm and dry. No rash noted.  Psychiatric: She is agitated.  Nursing note and vitals reviewed.   ED Course  Procedures (including critical care time) Labs Review Labs Reviewed  BASIC METABOLIC PANEL - Abnormal; Notable for the following:    Glucose, Bld 100 (*)    BUN 21 (*)    Creatinine, Ser 1.25 (*)    GFR calc non Af Amer 43 (*)    GFR calc Af Amer 50 (*)    All other components within normal limits  CBC WITH DIFFERENTIAL/PLATELET - Abnormal; Notable for the following:    Platelets 131 (*)    All other components within normal limits  URINALYSIS, ROUTINE W REFLEX MICROSCOPIC (NOT AT [facility]) - Abnormal; Notable for the following:    Hgb urine dipstick TRACE (*)    Nitrite POSITIVE (*)    Leukocytes, UA TRACE (*)    All other components within normal limits  URINE MICROSCOPIC-ADD ON - Abnormal; Notable for the following:    Squamous Epithelial / LPF 0-5 (*)    Bacteria, UA MANY (*)    All other components within normal limits  URINE CULTURE  SEDIMENTATION RATE  C-REACTIVE PROTEIN  I-STAT CG4 LACTIC ACID, ED    Imaging Review Dg Lumbar Spine Complete  05/21/2015  CLINICAL DATA:  69 year old female with persistent lumbar spine pain after falling 3-4 weeks previously  EXAM: LUMBAR SPINE - COMPLETE 4+ VIEW COMPARISON:  Prior CT abdomen/pelvis 02/09/2010 FINDINGS: No evidence of acute fracture or malalignment. Mild multilevel endplate spurring. No lytic or blastic osseous lesion. Multilevel degenerative disc disease. Lower lumbar facet arthropathy. Chronic bilateral L5 pars defects are better demonstrated on prior CT imaging. Approximately 9 mm anterolisthesis of L5 on S1. No significant interval progression compared to prior imaging. Aortic atherosclerotic calcifications. IMPRESSION: 1. No acute fracture or malalignment. 2. Chronic bilateral L5 pars defects with grade 1 anterolisthesis of L5 on S1. 3. Multilevel degenerative disc disease and mild lower lumbar facet arthropathy. 4. Aortic atherosclerosis. Electronically Signed   By: Malachy Moan M.D.   On: 05/21/2015 12:12   Ct Pelvis Wo Contrast  05/21/2015  CLINICAL DATA:  Fall 3- 4 weeks ago. Left posterior leg pain radiating down leg. EXAM: CT PELVIS WITHOUT CONTRAST TECHNIQUE: Multidetector CT imaging of the pelvis was performed following the  standard protocol without intravenous contrast. COMPARISON:  Plain films of earlier today.  CT of 02/09/2010 FINDINGS: Soft tissues: Aortic and branch vessel atherosclerosis. No pelvic adenopathy. Hysterectomy. Pelvic floor laxity. No soft tissue hematoma. Disc bulge at L4-5 causing mild bilateral neural foraminal narrowing. Bones: No acute fracture or dislocation. Bilateral L5 pars defects with 6 mm of anterolisthesis of L5 on S1. Joint spaces are maintained for age. IMPRESSION: 1.  No acute osseous abnormality. 2. L5 pars defects with grade 1 L5-S1 anterolisthesis. 3. 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 Electronically Signed   By: Jeronimo Greaves M.D.   On: 05/21/2015 15:17   Dg Knee Complete 4 Views Left  05/21/2015  CLINICAL DATA:  Status post fall 3-feet 4 weeks ago. EXAM: LEFT KNEE - COMPLETE 4+ VIEW COMPARISON:  None. FINDINGS: There is no evidence of fracture, dislocation, or joint  effusion. There is no evidence of arthropathy or other focal bone abnormality. Soft tissues are unremarkable. IMPRESSION: No acute osseous injury of the left knee. Electronically Signed   By: Elige Ko   On: 05/21/2015 12:10   Dg Knee Complete 4 Views Right  05/21/2015  CLINICAL DATA:  Larey Seat 3-4 weeks ago, leg pain EXAM: RIGHT KNEE - COMPLETE 4+ VIEW COMPARISON:  None FINDINGS: Osseous demineralization. Joint spaces fairly well preserved. Patellar spur at quadriceps tendon insertion. No acute fracture, dislocation or bone destruction. No knee joint effusion or regional soft tissue abnormalities. IMPRESSION: Osseous demineralization. No acute RIGHT knee abnormalities. Electronically Signed   By: Ulyses Southward M.D.   On: 05/21/2015 12:10   Dg Hips Bilat With Pelvis 3-4 Views  05/21/2015  CLINICAL DATA:  69 year old female with persistent hip pain after falling 3 or 4 weeks ago. EXAM: DG HIP (WITH OR WITHOUT PELVIS) 3-4V BILAT COMPARISON:  None. FINDINGS: There is no evidence of hip fracture or dislocation. There is no evidence of arthropathy or other focal bone abnormality. IMPRESSION: Negative. Electronically Signed   By: Malachy Moan M.D.   On: 05/21/2015 12:10   I have personally reviewed and evaluated these images and lab results as part of my medical decision-making.   EKG Interpretation   Date/Time:  Tuesday May 21 2015 10:34:55 EST Ventricular Rate:  104 PR Interval:  169 QRS Duration: 72 QT Interval:  340 QTC Calculation: 447 R Axis:   43 Text Interpretation:  Sinus tachycardia Abnormal R-wave progression, early  transition Rate faster Confirmed by RANCOUR  MD, STEPHEN [phone]) on  05/21/2015 11:00:03 AM      MDM   Final diagnoses:  Urinary tract infection, site not specified  Pain of left thigh    Pt appears unkept, poor hygiene.  Mucous membranes appear dry.  Possible dehydration.  Pt also seen by Dr. Manus Gunning and care plan discussed.  Pt with likely UTI.  Urine  culture pending.  Possible radiculopathy but no concerning sx's for emergent neurological process, no concerning sx's for DVT.  Will continue fluids. IV Rocephin given .  Will consult hospitalist for admit.  Pt's daughter :  Christian Mate  Ph# [phone]  [id number] consulted Dr. Conley Rolls  Will see pt in ED and evaluate in ED  1720  Patient was seen by Dr. Conley Rolls and he feels that patient is stable for discharge, and under his guidance, I was asked to d/c patient with tx for UTI and PCP f/u.    Pauline Aus, PA-C 05/21/15 1738  Glynn Octave, MD 05/21/15 [phone]

## 2015-05-21 NOTE — ED Notes (Signed)
Per ems: Pt fell 3-4 weeks ago with no known injury.  Today pt has left posterior leg pain radiating down leg.  Pt has no rotation or shortening of limb, using wheelchair. Pt alert and oriented.  193/121 (pt has not taken any meds today ) 130 p, 96% ra

## 2015-05-21 NOTE — ED Notes (Signed)
Pt refused ambulation.

## 2015-05-21 NOTE — Consult Note (Signed)
[facility] History and Physical  Tanya Ruiz ZOX:[id number] DOB: Nov 14, 1945    PCP:  Physician at [facility].   Chief Complaint: left hip pain.   PHYSICIAN ASKING FOR CONSULTATION:  DR Manus Gunning.   HPI: Tanya Ruiz is an 69 y.o. female with hx of HTN, CVA resulting in wheel chair bound, ITP, COPD, lives at home with her son, fell several weeks ago, presented to the ER with left hip pain.  She has had no fever, chills, confusion, weakness, dyuria, nausea, vomitting or abdominal pain.  Evalaution in the ER showed Platelet count of 131K, Hb of 14 g per dL, and no leukocytosis.  She has normal K, and cr was unremarkable.  CT scan of her head, pelvic, and plain films of her knees, LS spine showed no acute processes.  I spoke with her, offered overnight observation, she didn't want to.  She prefers her [facility] at [facility].  She doesn't want to be in the assisted living or SNF placement with her current situation.  She said she was able to get along well after her fall.  When asking if she wanted me to speak with her sister, her son or her daughter, she vehemently refused to give me permission to do so.   She does not want to have any of these as her surrogate decision makers.   Rewiew of Systems:  Constitutional: Negative for malaise, fever and chills. No significant weight loss or weight gain Eyes: Negative for eye pain, redness and discharge, diplopia, visual changes, or flashes of light. ENMT: Negative for ear pain, hoarseness, nasal congestion, sinus pressure and sore throat. No headaches; tinnitus, drooling, or problem swallowing. Cardiovascular: Negative for chest pain, palpitations, diaphoresis, dyspnea and peripheral edema. ; No orthopnea, PND Respiratory: Negative for cough, hemoptysis, wheezing and stridor. No pleuritic chestpain. Gastrointestinal: Negative for nausea, vomiting, diarrhea, constipation, abdominal pain, melena, blood in stool, hematemesis, jaundice and rectal  bleeding.    Genitourinary: Negative for frequency, dysuria, incontinence,flank pain and hematuria; Musculoskeletal: Negative for back pain and neck pain. Negative for swelling  Skin: . Negative for pruritus, rash, abrasions, bruising and skin lesion.; ulcerations Neuro: Negative for headache, lightheadedness and neck stiffness. Negative for weakness, altered level of consciousness , altered mental status, extremity weakness, burning feet, involuntary movement, seizure and syncope.  Psych: negative for anxiety, depression, insomnia, tearfulness, panic attacks, hallucinations, paranoia, suicidal or homicidal ideation    Past Medical History  Diagnosis Date  . Hypertension   . Stroke (HCC)   . ITP (idiopathic thrombocytopenic purpura)   . DDD (degenerative disc disease), lumbar   . COPD (chronic obstructive pulmonary disease) [facility])     Past Surgical History  Procedure Laterality Date  . Pleural scarification    . Abdominal hysterectomy      Medications:  HOME MEDS: Prior to Admission medications   Medication Sig Start Date End Date Taking? Authorizing Provider  albuterol (PROVENTIL) (2.5 MG/3ML) 0.083% nebulizer solution Take 2.5 mg by nebulization every 6 (six) hours as needed for wheezing or shortness of breath.  03/06/15  Yes Historical Provider, MD  amLODipine (NORVASC) 5 MG tablet Take 5 mg by mouth daily.   Yes Historical Provider, MD  aspirin 325 MG tablet Take 325 mg by mouth every morning.   Yes Historical Provider, MD     Allergies:  Allergies  Allergen Reactions  . Erythromycin     UNKNOWN  . Penicillins Other (See Comments)    Unknown. Has patient had a PCN  reaction causing immediate rash, facial/tongue/throat swelling, SOB or lightheadedness with hypotension:  unknown Has patient had a PCN reaction causing severe rash involving mucus membranes or skin necrosis: unknown Has patient had a PCN reaction that required hospitalization: unknown Has patient had a PCN  reaction occurring within the last 10 years: no If all of the above answers are "NO", then may proceed with Cephalosporin use.     Social History:   reports that she has been smoking.  She does not have any smokeless tobacco history on file. She reports that she does not drink alcohol or use illicit drugs.  Family History: History reviewed. No pertinent family history.   Physical Exam: Filed Vitals:   05/21/15 1600 05/21/15 1630 05/21/15 1645 05/21/15 1700  BP: 144/92 139/91  152/91  Pulse: 82 87 81   Temp:      TempSrc:      Resp: 15 15 19 18   Height:      Weight:      SpO2: 97% 95% 96%    Blood pressure 152/91, pulse 81, temperature 98.6 F (37 C), temperature source Oral, resp. rate 18, height 5\' 4"  (1.626 m), weight 58.968 kg (130 lb), SpO2 96 %.  GEN:  Pleasant  patient lying in the stretcher in no acute distress; cooperative with exam. PSYCH:  alert and oriented x4; does not appear anxious or depressed; affect is appropriate. HEENT: Mucous membranes pink and anicteric; PERRLA; EOM intact; no cervical lymphadenopathy nor thyromegaly or carotid bruit; no JVD; There were no stridor. Neck is very supple. Breasts:: Not examined CHEST WALL: No tenderness CHEST: Normal respiration, clear to auscultation bilaterally.  HEART: Regular rate and rhythm.  There are no murmur, rub, or gallops.   BACK: No kyphosis or scoliosis; no CVA tenderness ABDOMEN: soft and non-tender; no masses, no organomegaly, normal abdominal bowel sounds; no pannus; no intertriginous candida. There is no rebound and no distention. Rectal Exam: Not done EXTREMITIES: No bone or joint deformity; age-appropriate arthropathy of the hands and knees; no edema; no ulcerations.  There is no calf tenderness. Genitalia: not examined PULSES: 2+ and symmetric SKIN: Normal hydration no rash or ulceration CNS: She is able to move her  Left leg without significant pain.  Palpation does elicit some tenderness, but not  severe.   Labs on Admission:  Basic Metabolic Panel:  Recent Labs Lab 05/21/15 1058  NA 141  K 4.0  CL 109  CO2 22  GLUCOSE 100*  BUN 21*  CREATININE 1.25*  CALCIUM 10.0   CBC:  Recent Labs Lab 05/21/15 1058  WBC 10.2  NEUTROABS 7.6  HGB 14.3  HCT 42.0  MCV 92.3  PLT 131*    Radiological Exams on Admission: Dg Lumbar Spine Complete  05/21/2015  CLINICAL DATA:  69 year old female with persistent lumbar spine pain after falling 3-4 weeks previously EXAM: LUMBAR SPINE - COMPLETE 4+ VIEW COMPARISON:  Prior CT abdomen/pelvis 02/09/2010 FINDINGS: No evidence of acute fracture or malalignment. Mild multilevel endplate spurring. No lytic or blastic osseous lesion. Multilevel degenerative disc disease. Lower lumbar facet arthropathy. Chronic bilateral L5 pars defects are better demonstrated on prior CT imaging. Approximately 9 mm anterolisthesis of L5 on S1. No significant interval progression compared to prior imaging. Aortic atherosclerotic calcifications. IMPRESSION: 1. No acute fracture or malalignment. 2. Chronic bilateral L5 pars defects with grade 1 anterolisthesis of L5 on S1. 3. Multilevel degenerative disc disease and mild lower lumbar facet arthropathy. 4. Aortic atherosclerosis. Electronically Signed   By: Vilma PraderHeath  Archer Asa M.D.   On: 05/21/2015 12:12   Ct Head Wo Contrast  05/21/2015  CLINICAL DATA:  Fall 3 weeks ago with altered mental status. Incontinence. EXAM: CT HEAD WITHOUT CONTRAST TECHNIQUE: Contiguous axial images were obtained from the base of the skull through the vertex without intravenous contrast. COMPARISON:  05/07/2013 FINDINGS: Sinuses/Soft tissues: Mild Motion degradation. No significant soft tissue swelling. Clear paranasal sinuses and mastoid air cells. Intracranial: Re- demonstration of encephalomalacia within both high cerebral hemispheres, the left parietal and occipital lobes. Moderate low density in the periventricular white matter likely related to  small vessel disease. Given motion degradation, no mass lesion, hemorrhage, hydrocephalus, acute infarct, intra-axial, or extra-axial fluid collection. IMPRESSION: 1. Motion degradation. 2. Given this factor, no acute intracranial abnormality. 3. Moderate small vessel ischemic change with multifocal chronic encephalomalacia. Electronically Signed   By: Jeronimo Greaves M.D.   On: 05/21/2015 16:21   Ct Pelvis Wo Contrast  05/21/2015  CLINICAL DATA:  Fall 3- 4 weeks ago. Left posterior leg pain radiating down leg. EXAM: CT PELVIS WITHOUT CONTRAST TECHNIQUE: Multidetector CT imaging of the pelvis was performed following the standard protocol without intravenous contrast. COMPARISON:  Plain films of earlier today.  CT of 02/09/2010 FINDINGS: Soft tissues: Aortic and branch vessel atherosclerosis. No pelvic adenopathy. Hysterectomy. Pelvic floor laxity. No soft tissue hematoma. Disc bulge at L4-5 causing mild bilateral neural foraminal narrowing. Bones: No acute fracture or dislocation. Bilateral L5 pars defects with 6 mm of anterolisthesis of L5 on S1. Joint spaces are maintained for age. IMPRESSION: 1.  No acute osseous abnormality. 2. L5 pars defects with grade 1 L5-S1 anterolisthesis. 3. 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 Electronically Signed   By: Jeronimo Greaves M.D.   On: 05/21/2015 15:17   Dg Knee Complete 4 Views Left  05/21/2015  CLINICAL DATA:  Status post fall 3-feet 4 weeks ago. EXAM: LEFT KNEE - COMPLETE 4+ VIEW COMPARISON:  None. FINDINGS: There is no evidence of fracture, dislocation, or joint effusion. There is no evidence of arthropathy or other focal bone abnormality. Soft tissues are unremarkable. IMPRESSION: No acute osseous injury of the left knee. Electronically Signed   By: Elige Ko   On: 05/21/2015 12:10   Dg Knee Complete 4 Views Right  05/21/2015  CLINICAL DATA:  Larey Seat 3-4 weeks ago, leg pain EXAM: RIGHT KNEE - COMPLETE 4+ VIEW COMPARISON:  None FINDINGS: Osseous demineralization. Joint spaces  fairly well preserved. Patellar spur at quadriceps tendon insertion. No acute fracture, dislocation or bone destruction. No knee joint effusion or regional soft tissue abnormalities. IMPRESSION: Osseous demineralization. No acute RIGHT knee abnormalities. Electronically Signed   By: Ulyses Southward M.D.   On: 05/21/2015 12:10   Dg Hips Bilat With Pelvis 3-4 Views  05/21/2015  CLINICAL DATA:  69 year old female with persistent hip pain after falling 3 or 4 weeks ago. EXAM: DG HIP (WITH OR WITHOUT PELVIS) 3-4V BILAT COMPARISON:  None. FINDINGS: There is no evidence of hip fracture or dislocation. There is no evidence of arthropathy or other focal bone abnormality. IMPRESSION: Negative. Electronically Signed   By: Malachy Moan M.D.   On: 05/21/2015 12:10    PLAN:  My recommendation is to discharge her back to her home via EMS.  She doesn't want to be admitted into the hospital, and it is not absolutely necessary to do so.  She has a borderline UA, and I agree with treatment.  She is wheel chair bound as her baseline, so she can get around.  Since  the CT scan showed no fx, no intervention is needed.  I suspect perhap a contusion from a fall?  Even an occult Fx missed by the CT would require no surgical intervention.   She can follow up with her PCP.  Thank you so much for asking me to participate in her care.  Good Day.   Other plans as per orders. Code Status: FULL Unk Lightning, MD. FACP [facility] Pager [phone] 7pm to 7am.  05/21/2015, 5:25 PM

## 2015-05-23 LAB — URINE CULTURE

## 2016-09-22 DEATH — deceased
